# Patient Record
Sex: Female | Born: 1970 | Race: White | Hispanic: No | Marital: Single | Smoking: Never smoker
Health system: Southern US, Community
[De-identification: ages and names within clinical notes are randomized; demographics above are authoritative.]

---

## 2015-05-21 ENCOUNTER — Ambulatory Visit (HOSPITAL_BASED_OUTPATIENT_CLINIC_OR_DEPARTMENT_OTHER): Payer: Self-pay

## 2017-07-26 ENCOUNTER — Encounter (HOSPITAL_BASED_OUTPATIENT_CLINIC_OR_DEPARTMENT_OTHER): Payer: Self-pay | Admitting: *Deleted

## 2017-07-26 ENCOUNTER — Emergency Department (HOSPITAL_BASED_OUTPATIENT_CLINIC_OR_DEPARTMENT_OTHER)
Admission: EM | Admit: 2017-07-26 | Discharge: 2017-07-26 | Disposition: A | Discharge status: Home / Self Care | Payer: Self-pay | Attending: Emergency Medicine | Admitting: Emergency Medicine

## 2017-07-26 ENCOUNTER — Other Ambulatory Visit: Payer: Self-pay

## 2017-07-26 ENCOUNTER — Emergency Department (HOSPITAL_BASED_OUTPATIENT_CLINIC_OR_DEPARTMENT_OTHER): Payer: Self-pay

## 2017-07-26 DIAGNOSIS — M25561 Pain in right knee: Secondary | ICD-10-CM | POA: Insufficient documentation

## 2017-07-26 DIAGNOSIS — Z79899 Other long term (current) drug therapy: Secondary | ICD-10-CM | POA: Insufficient documentation

## 2017-07-26 MED ORDER — ACETAMINOPHEN 325 MG PO TABS
650.0000 mg | ORAL_TABLET | Freq: Once | ORAL | Status: AC
  Administered 2017-07-26: 650 mg via ORAL
  Filled 2017-07-26: qty 2

## 2017-07-26 NOTE — ED Provider Notes (Signed)
[facility] EMERGENCY DEPARTMENT Provider Note   CSN: [id number] Arrival date & time: 07/26/17  1321     History   Chief Complaint Chief Complaint  Patient presents with  . Knee Pain    HPI Laura Singh is a 46 y.o. female who presents with right knee pain that began this morning.  Patient does not recall any preceding trauma, injury, fall.  She does report that she recently started a new job that requires more standing.  Patient reports that she has not taking any medication for the pain.  Patient reports that pain is worsened with movement of the leg.  She states that she has been able to ambulate but it exacerbates her pain.  Patient denies any leg swelling, redness, fevers, numbness/weakness.  The history is provided by the patient.    History reviewed. No pertinent past medical history.  There are no active problems to display for this patient.   History reviewed. No pertinent surgical history.  OB History    No data available       Home Medications    Prior to Admission medications   Medication Sig Start Date End Date Taking? Authorizing Provider  GABAPENTIN PO Take by mouth.   Yes [provider]  QUEtiapine Fumarate (SEROQUEL PO) Take by mouth.   Yes [provider]    Family History No family history on file.  Social History Social History   Tobacco Use  . Smoking status: Never Smoker  . Smokeless tobacco: Never Used  Substance Use Topics  . Alcohol use: No    Frequency: Never  . Drug use: No     Allergies   Penicillins   Review of Systems Review of Systems  Musculoskeletal:       Right knee pain     Physical Exam Updated Vital Signs BP 125/80   Pulse 88   Temp 98.5 F (36.9 C) (Oral)   Resp 18   Ht 5' 7.5" (1.715 m)   Wt 108.9 kg (240 lb)   LMP 07/23/2017   SpO2 98%   BMI 37.03 kg/m   Physical Exam  Constitutional: She appears well-developed and well-nourished.  HENT:  Head: Normocephalic and  atraumatic.  Eyes: Conjunctivae and EOM are normal. Right eye exhibits no discharge. Left eye exhibits no discharge. No scleral icterus.  Cardiovascular:  Pulses:      Dorsalis pedis pulses are 2+ on the right side, and 2+ on the left side.  Pulmonary/Chest: Effort normal.  Musculoskeletal:  There is palpation to the anterior medial aspect of the right knee.  Very minimal overlying soft tissue swelling.  No warmth or erythema noted.  No deformity or crepitus noted.  No posterior knee tenderness.  No calf tenderness.  No edema of the lower extremity.  Negative anterior posterior drawer test of the right knee.  No instability noted on varus or valgus stress.  Neurological: She is alert.  Skin: Skin is warm and dry. Capillary refill takes less than 2 seconds.  Psychiatric: She has a normal mood and affect. Her speech is normal and behavior is normal.  Nursing note and vitals reviewed.    ED Treatments / Results  Labs (all labs ordered are listed, but only abnormal results are displayed) Labs Reviewed - No data to display  EKG  EKG Interpretation None       Radiology Dg Knee Complete 4 Views Right  Result Date: 07/26/2017 CLINICAL DATA:  Pain and swelling without injury, initial encounter EXAM:  RIGHT KNEE - COMPLETE 4+ VIEW COMPARISON:  None. FINDINGS: No evidence of fracture, dislocation, or joint effusion. No evidence of arthropathy or other focal bone abnormality. Soft tissues are unremarkable. IMPRESSION: No acute abnormality noted. Electronically Signed   By: Alcide CleverMark  Lukens M.D.   On: 07/26/2017 13:49    Procedures Procedures (including critical care time)  Medications Ordered in ED Medications  acetaminophen (TYLENOL) tablet 650 mg (not administered)     Initial Impression / Assessment and Plan / ED Course  I have reviewed the triage vital signs and the nursing notes.  Pertinent labs & imaging results that were available during my care of the patient were reviewed by me  and considered in my medical decision making (see chart for details).     46 year old female who presents for evaluation of right knee pain that began today.  Does not recall any preceding trauma, injury, fall.  She does state that she recently started a new job that requires more standing movement.  No redness, swelling, fevers.  No calf tenderness.  No recent travel. Patient is afebrile, non-toxic appearing, sitting comfortably on examination table. Vital signs reviewed and stable. Patient is neurovascularly intact.  Physical exam, patient has tenderness palpation of the medial aspect of the right knee.  There is some very mild soft tissue swelling but no overlying warmth, erythema noted.  No calf tenderness.  No swelling of the distal lower extremity.  Consider sprain versus strain versus arthritis versus muscular injury.  History/physical exam not concerning for bakers cyst, DVT or septic arthritis.  X-rays ordered at triage.  X-rays reviewed.  Negative for any acute fracture or dislocation.  No evidence of effusion.  Discussed results with patient.  We will plan to treat as a possible strain versus muscle skeletal injury.  Patient does have an orthopedic doctor.  Encouraged to follow-up with orthopedic for further evaluation.  Encourage conservative at home therapies. Patient had ample opportunity for questions and discussion. All patient's questions were answered with full understanding. Strict return precautions discussed. Patient expresses understanding and agreement to plan.    Final Clinical Impressions(s) / ED Diagnoses   Final diagnoses:  Acute pain of right knee    ED Discharge Orders    None       Rosana HoesLayden, Lindsey A, PA-C 07/26/17 2159    Lavera GuiseLiu, Dana Duo, MD 07/29/17 2204

## 2017-07-26 NOTE — ED Triage Notes (Signed)
Right knee pain. She woke with pain. No known injury.

## 2017-07-26 NOTE — Discharge Instructions (Signed)
You can take Tylenol or Ibuprofen as directed for pain. You can alternate Tylenol and Ibuprofen every 4 hours. If you take Tylenol at 1pm, then you can take Ibuprofen at 5pm. Then you can take Tylenol again at 9pm.   Follow the RICE (Rest, Ice, Compression, Elevation) protocol as directed.   Follow-up with your orthopedic doctor or the referred orthopedic doctor for further evaluation.  Return emerge pain, redness or swelling of the knee, numbness or weakness in the leg or any other worsening or concerning symptoms.

## 2017-07-26 NOTE — ED Notes (Signed)
Pt discharged to home NAD.  

## 2017-07-28 ENCOUNTER — Other Ambulatory Visit: Payer: Self-pay

## 2017-07-28 ENCOUNTER — Encounter (HOSPITAL_BASED_OUTPATIENT_CLINIC_OR_DEPARTMENT_OTHER): Payer: Self-pay

## 2017-07-28 ENCOUNTER — Emergency Department (HOSPITAL_BASED_OUTPATIENT_CLINIC_OR_DEPARTMENT_OTHER)
Admission: EM | Admit: 2017-07-28 | Discharge: 2017-07-28 | Disposition: A | Discharge status: Home / Self Care | Payer: Self-pay | Attending: Emergency Medicine | Admitting: Emergency Medicine

## 2017-07-28 DIAGNOSIS — M79604 Pain in right leg: Secondary | ICD-10-CM | POA: Insufficient documentation

## 2017-07-28 DIAGNOSIS — M25561 Pain in right knee: Secondary | ICD-10-CM

## 2017-07-28 DIAGNOSIS — Z79899 Other long term (current) drug therapy: Secondary | ICD-10-CM | POA: Insufficient documentation

## 2017-07-28 MED ORDER — DICLOFENAC SODIUM 1 % TD GEL: 2.0000 g | Freq: Four times a day (QID) | TRANSDERMAL | 0 refills | Status: AC

## 2017-07-28 NOTE — ED Provider Notes (Signed)
[facility] EMERGENCY DEPARTMENT Provider Note   CSN: [id number] Arrival date & time: 07/28/17  1151     History   Chief Complaint Chief Complaint  Patient presents with  . Knee Pain    HPI Laura Singh is a 46 y.o. female.  HPI   Patient is a 46 year old female with a history of low back pain, bipolar disorder, hyperlipidemia, presenting for pain in her right knee.  Patient reports that she woke up approximately 48 hours ago with acute pain in the right knee.  Patient had just started a new job with ambulation and kneeling on concrete prior to the onset of this pain.  No associated specific injury with this knee pain.  Knee has not been erythematous or warm.  Patient feels she notes that the knee was swollen.  No fever or chills.  Patient reports that she feels a shooting pain down her right leg on the anterior surface.  Patient reports she has had this pain before.  Patient presented 2 days ago for the symptoms.  Negative x-ray at that time.  No effusion noted on x-ray.  Patient discharged with naproxen and knee sleeve.  Subsequently, patient reports she is a difficulty performing her tasks at work due to this knee pain.  History reviewed. No pertinent past medical history.  There are no active problems to display for this patient.   History reviewed. No pertinent surgical history.  OB History    No data available       Home Medications    Prior to Admission medications   Medication Sig Start Date End Date Taking? Authorizing Provider  NAPROXEN PO Take by mouth.   Yes [provider]  diclofenac sodium (VOLTAREN) 1 % GEL Apply 2 g topically 4 (four) times daily for 7 days. 07/28/17 08/04/17  Aviva KluverMurray, Carrol Bondar B, PA-C  GABAPENTIN PO Take by mouth.    [provider]  QUEtiapine Fumarate (SEROQUEL PO) Take by mouth.    [provider]    Family History No family history on file.  Social History Social History   Tobacco Use  .  Smoking status: Never Smoker  . Smokeless tobacco: Never Used  Substance Use Topics  . Alcohol use: No    Frequency: Never  . Drug use: No     Allergies   Penicillins   Review of Systems Review of Systems  Constitutional: Negative for chills and fever.  Musculoskeletal: Positive for arthralgias and joint swelling.  Skin: Negative for color change.  Neurological: Negative for weakness and numbness.     Physical Exam Updated Vital Signs BP (!) 125/98 (BP Location: Left Arm)   Pulse 98   Temp 98.4 F (36.9 C) (Oral)   Resp 20   Ht 5\' 7"  (1.702 m)   Wt 116.6 kg (257 lb)   LMP 07/23/2017   SpO2 98%   BMI 40.25 kg/m   Physical Exam  Constitutional: She appears well-developed and well-nourished. No distress.  Sitting comfortably in bed.  HENT:  Head: Normocephalic and atraumatic.  Eyes: Conjunctivae are normal. Right eye exhibits no discharge. Left eye exhibits no discharge.  EOMs normal to gross examination.  Neck: Normal range of motion.  Cardiovascular: Normal rate and regular rhythm.  Intact, 2+ DP pulse of the right lower extremity.  Pulmonary/Chest:  Normal respiratory effort. Patient converses comfortably. No audible wheeze or stridor.  Abdominal: She exhibits no distension.  Musculoskeletal: Normal range of motion.  Right knee with tenderness to palpation of  lateral joint. Full ROM. No joint line tenderness.  There is a slight swelling of medial and lateral infrapatellar region. No abnormal alignment or patellar mobility. No bruising, erythema or warmth overlaying the joint. No varus/valgus laxity. Negative drawer's, Lachman's and McMurray's.  No crepitus.  2+ DP pulses bilaterally. All compartments are soft. Sensation intact distal to injury. Antalgic gait.  Neurological: She is alert.  Cranial nerves intact to gross observation. Patient moves extremities without difficulty.  Skin: Skin is warm and dry. She is not diaphoretic.  Psychiatric: She has a normal  mood and affect. Her behavior is normal. Judgment and thought content normal.  Nursing note and vitals reviewed.    ED Treatments / Results  Labs (all labs ordered are listed, but only abnormal results are displayed) Labs Reviewed - No data to display  EKG  EKG Interpretation None       Radiology Dg Knee Complete 4 Views Right  Result Date: 07/26/2017 CLINICAL DATA:  Pain and swelling without injury, initial encounter EXAM: RIGHT KNEE - COMPLETE 4+ VIEW COMPARISON:  None. FINDINGS: No evidence of fracture, dislocation, or joint effusion. No evidence of arthropathy or other focal bone abnormality. Soft tissues are unremarkable. IMPRESSION: No acute abnormality noted. Electronically Signed   By: Alcide CleverMark  Lukens M.D.   On: 07/26/2017 13:49    Procedures Procedures (including critical care time)  Medications Ordered in ED Medications - No data to display   Initial Impression / Assessment and Plan / ED Course  I have reviewed the triage vital signs and the nursing notes.  Pertinent labs & imaging results that were available during my care of the patient were reviewed by me and considered in my medical decision making (see chart for details).     Final Clinical Impressions(s) / ED Diagnoses   Final diagnoses:  Acute pain of right knee   Patient is nontoxic-appearing, afebrile, and in no acute distress.  X-rays performed 2 days ago reviewed, and reveal no bony abnormality or obvious effusion on x-ray.  Clinical presentation does not warrant repeating xray today.  Patient's knee examination is not concerning for ligamentous injury.  The anterior knee pain and leg pain is not consistent with DVT.  Intact distal pulses. Also doubt ruptured Baker's cyst.  The knee is not erythematous and patient exhibits full range of motion, therefore doubt septic arthritis.  There is a small possible effusion over the anterior surface of the knee that may represent bursitis versus degenerative meniscus  with associated effusion versus a small hemarthrosis.  This does not warrant arthrocentesis.  This was discussed with Dr. Loren Raceravid Yelverton.  I discussed these pathologies with the patient, and encouraged her to follow-up on Monday with the orthopedist.  Return precautions given for any fever or chills with symptoms, or erythematous, immobile knee.  Diclofenac gel added to patient's current regimen and encouraged rest, compression, ice, and elevation.  Patient is in understanding and agrees with the plan of care.  ED Discharge Orders        Ordered    diclofenac sodium (VOLTAREN) 1 % GEL  4 times daily     07/28/17 1318       Delia ChimesMurray, Ezmeralda Stefanick B, PA-C 07/28/17 1805    Loren RacerYelverton, David, MD 08/03/17 1505

## 2017-07-28 NOTE — Discharge Instructions (Signed)
It was my pleasure taking care of you today!   Please see the information and instructions below regarding your visit.  Your diagnoses today include:  1. Acute pain of right knee     Tests performed today include: See side panel of your discharge paperwork for testing performed today. Vital signs are listed at the bottom of these instructions.   Medications prescribed:    Take any prescribed medications only as prescribed, and any over the counter medications only as directed on the packaging.  Diclofenac gel as needed for pain. Use crutches as needed for comfort. Ice and elevate knee throughout the day.  Home care instructions:  Please follow any educational materials contained in this packet.   Follow-up instructions: Please follow-up with your primary care provider in as needed for further evaluation of your symptoms if they are not completely improved.   Call the orthopedist listed today or tomorrow to schedule follow up appointment for recheck of ongoing knee pain in one to two weeks. That appointment can be canceled with a 24-48 hour notice if complete resolution of pain.   Follow up with the orthopedist listed if symptoms do not improve in one week.   Return instructions:  Please return to the Emergency Department if you experience worsening symptoms.  Please return for any increasing swelling, increasing pain, loss of color to the lower leg, or increasing redness. Please return if you have any other emergent concerns.  Additional Information:  Your vital signs today were: BP (!) 125/98 (BP Location: Left Arm)    Pulse 98    Temp 98.4 F (36.9 C) (Oral)    Resp 20    Ht 5\' 7"  (1.702 m)    Wt 116.6 kg (257 lb)    LMP 07/23/2017    SpO2 98%    BMI 40.25 kg/m  If your blood pressure (BP) was elevated on multiple readings during this visit above 130 for the top number or above 80 for the bottom number, please have this repeated by your primary care provider within one  month. --------------

## 2017-07-28 NOTE — ED Triage Notes (Signed)
Pt c/o right knee pain x 3 days-seen here yesterday for same-[facility]NAD-limping gait

## 2017-07-28 NOTE — ED Notes (Signed)
Pt reports that she was unable to return to work today. Sts that she was put out of work until today.

## 2018-03-12 IMAGING — CR DG KNEE COMPLETE 4+V*R*
5 series · 5 of 5 positions shown · non-contrast
Comparison: None.

CLINICAL DATA: Pain and swelling without injury, initial encounter

EXAM:
RIGHT KNEE - COMPLETE 4+ VIEW

[t knee ap right (1 of 2)]
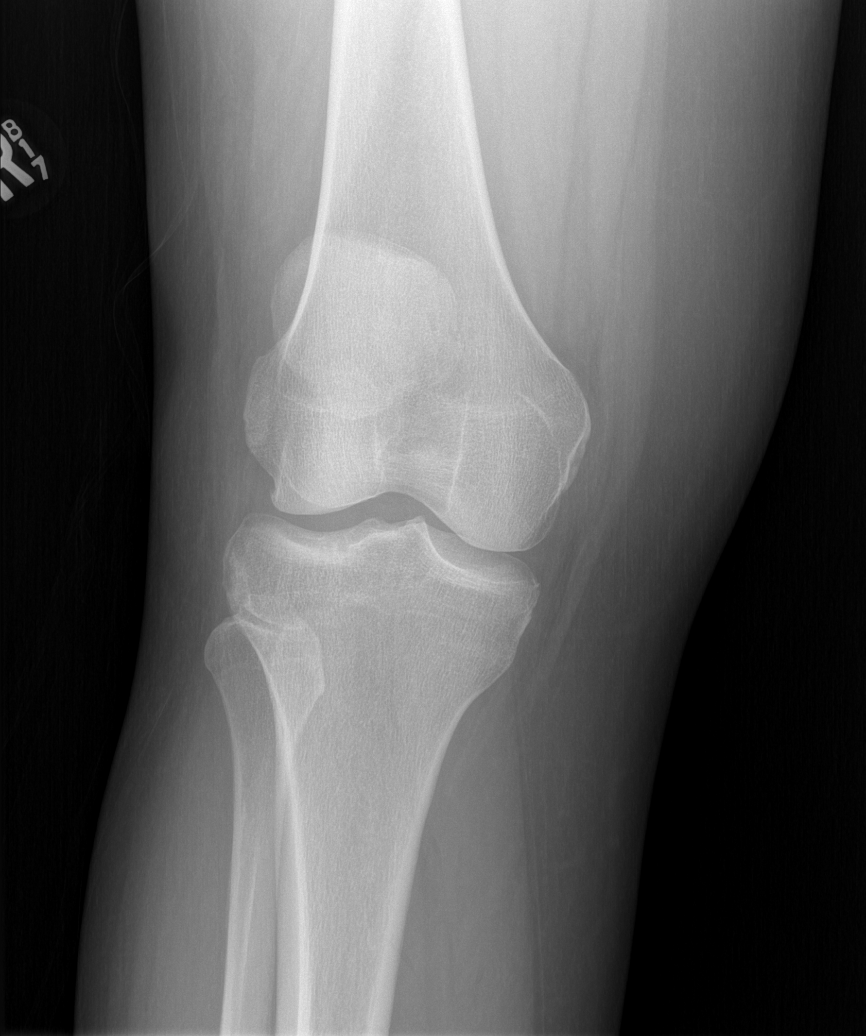

[t knee ap right (2 of 2)]
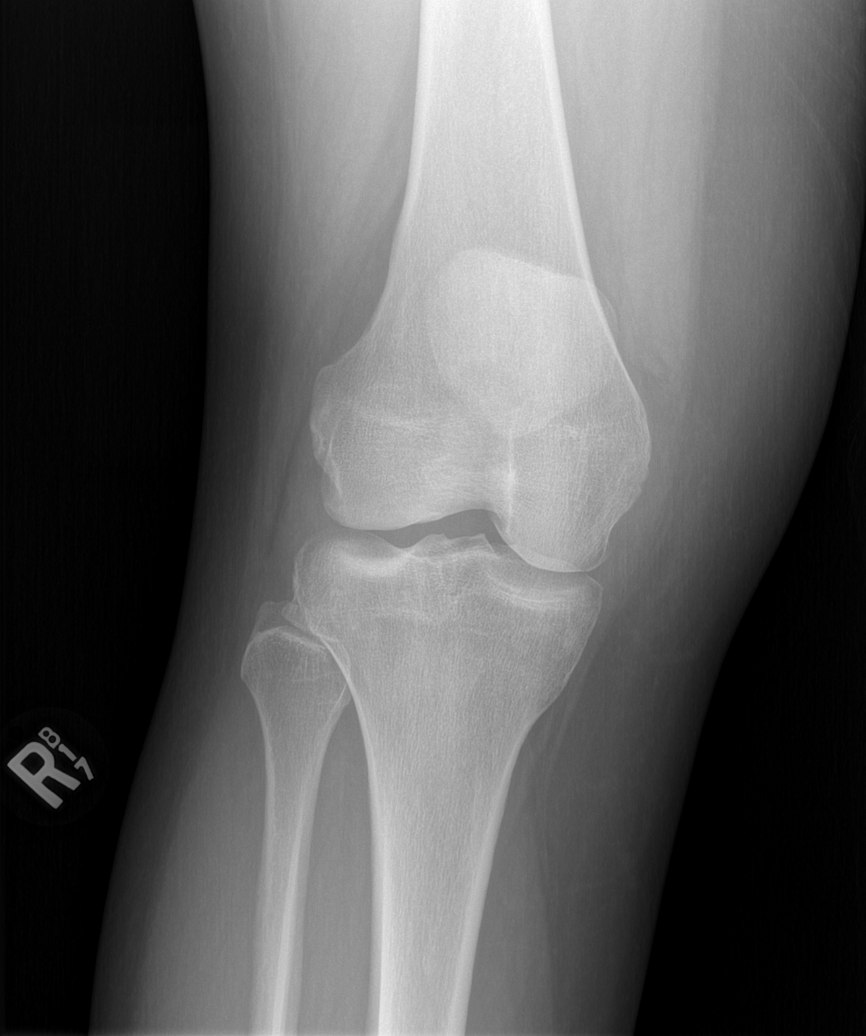

[t knee oblique right (1 of 2)]
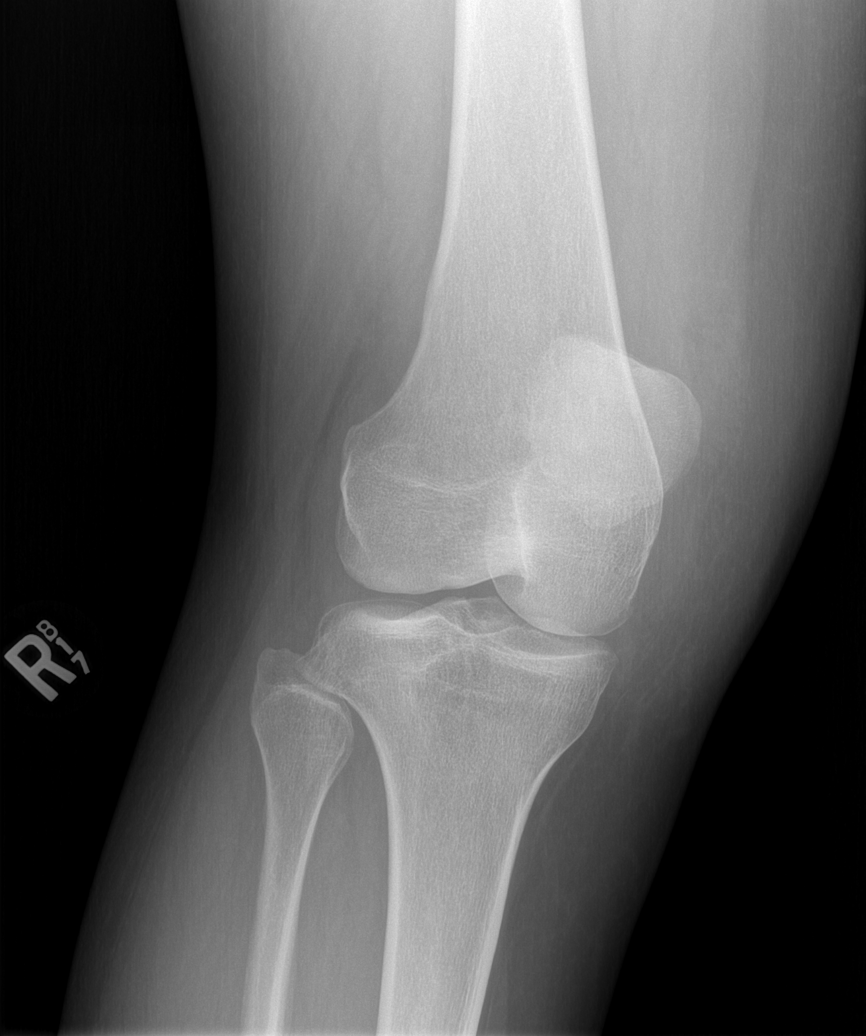

[t knee oblique right (2 of 2)]
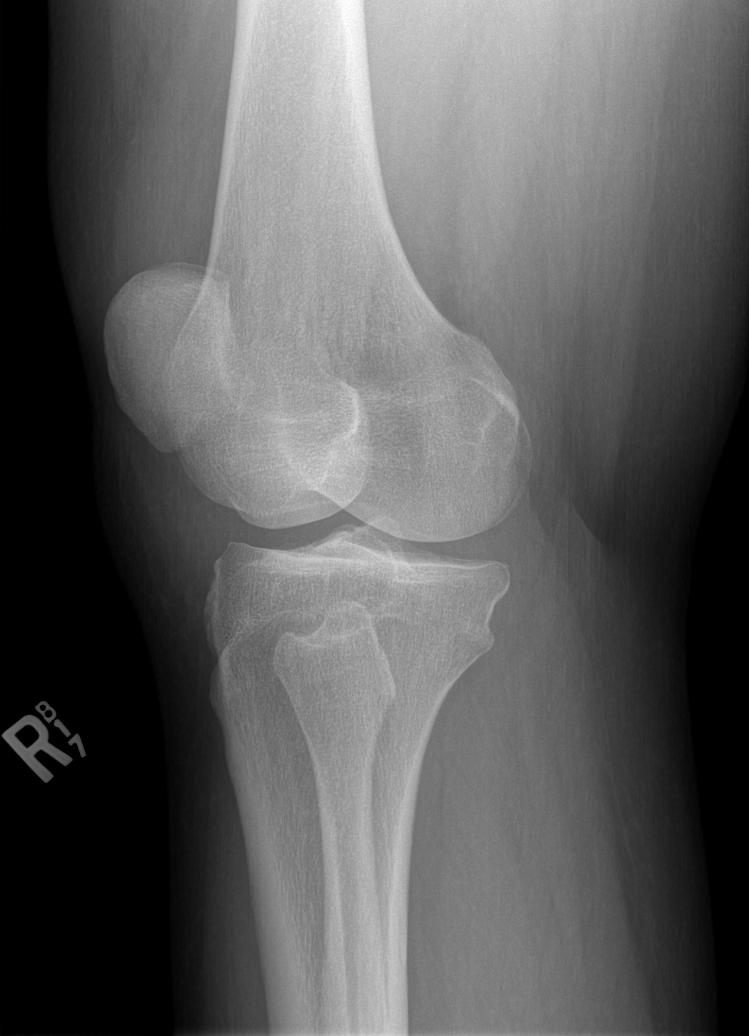

[t knee lat right]
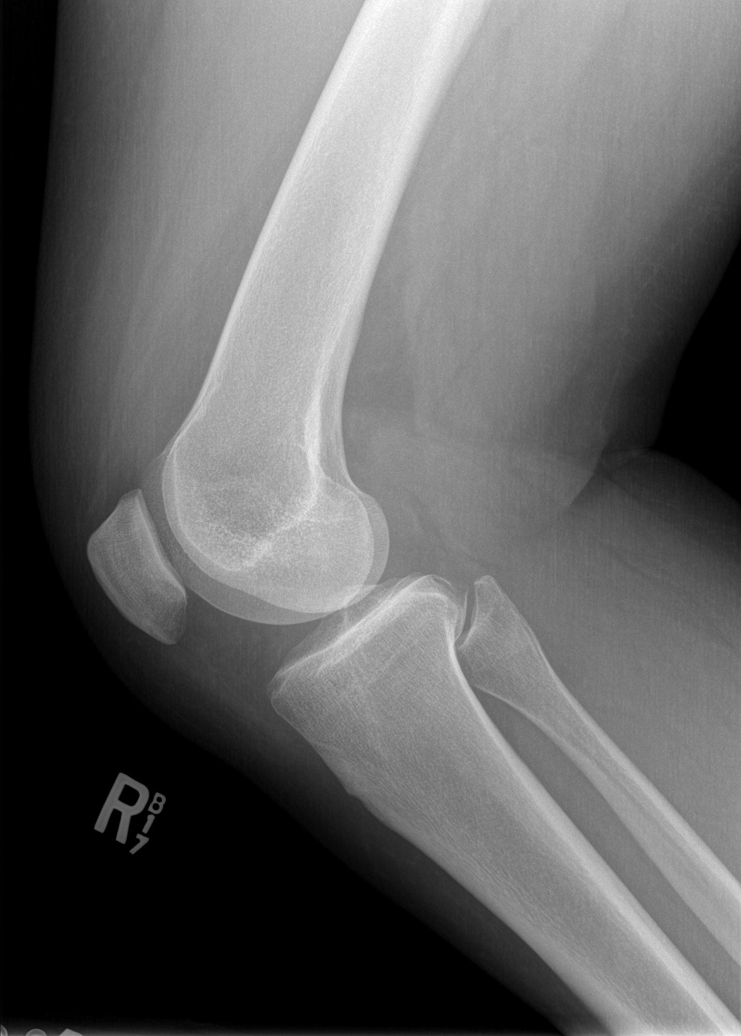

[5 of 5 positions shown; findings below may reference images not displayed]

FINDINGS: No evidence of fracture, dislocation, or joint effusion. No evidence
of arthropathy or other focal bone abnormality. Soft tissues are
unremarkable.
IMPRESSION: No acute abnormality noted.

## 2021-04-20 ENCOUNTER — Emergency Department (HOSPITAL_BASED_OUTPATIENT_CLINIC_OR_DEPARTMENT_OTHER)
Admission: EM | Admit: 2021-04-20 | Discharge: 2021-04-20 | Disposition: A | Discharge status: Home / Self Care | Payer: Self-pay | Attending: Emergency Medicine | Admitting: Emergency Medicine

## 2021-04-20 ENCOUNTER — Other Ambulatory Visit: Payer: Self-pay

## 2021-04-20 ENCOUNTER — Encounter (HOSPITAL_BASED_OUTPATIENT_CLINIC_OR_DEPARTMENT_OTHER): Payer: Self-pay | Admitting: *Deleted

## 2021-04-20 ENCOUNTER — Emergency Department (HOSPITAL_BASED_OUTPATIENT_CLINIC_OR_DEPARTMENT_OTHER): Payer: Self-pay

## 2021-04-20 DIAGNOSIS — R0981 Nasal congestion: Secondary | ICD-10-CM | POA: Insufficient documentation

## 2021-04-20 DIAGNOSIS — U071 COVID-19: Secondary | ICD-10-CM | POA: Insufficient documentation

## 2021-04-20 DIAGNOSIS — R000 Tachycardia, unspecified: Secondary | ICD-10-CM | POA: Insufficient documentation

## 2021-04-20 LAB — RESP PANEL BY RT-PCR (FLU A&B, COVID) ARPGX2
Influenza A by PCR: NEGATIVE
Influenza B by PCR: NEGATIVE
SARS Coronavirus 2 by RT PCR: POSITIVE — AB

## 2021-04-20 MED ORDER — NIRMATRELVIR/RITONAVIR (PAXLOVID)TABLET: 3.0000 | ORAL_TABLET | Freq: Two times a day (BID) | ORAL | 0 refills | Status: AC

## 2021-04-20 MED ORDER — ACETAMINOPHEN 500 MG PO TABS
1000.0000 mg | ORAL_TABLET | Freq: Once | ORAL | Status: AC
  Administered 2021-04-20: 1000 mg via ORAL
  Filled 2021-04-20: qty 2

## 2021-04-20 NOTE — ED Triage Notes (Signed)
Cough, subjective fever, loss of taste since thursday

## 2021-04-20 NOTE — Discharge Instructions (Addendum)
You were seen in the ER today for your loss of taste and congestion.  You tested positive COVID-19.  You have been prescribed combination of antiviral medications called Paxlovid, to treat your COVID-19 infection.  Please take this as prescribed for the next 5 days.  You may pick this up at the pharmacy listed below, free of charge.  Please do not take the Seroquel while you are on this medication.  You may use over-the-counter medications as needed for your symptoms.  You should not be in work for the next 7 days.  You may return to work on Thursday, 04/24/2021.  Return to the ER with any new difficulty breathing, nausea, does not stop, or any additional symptoms.

## 2021-04-20 NOTE — ED Provider Notes (Signed)
[facility] EMERGENCY DEPARTMENT Provider Note   CSN: [id number] Arrival date & time: 04/20/21  1334     History Chief Complaint  Patient presents with   Cough    Laura Singh is a 50 y.o. female who presents with concern Nonproductive cough, nasal congestion, runny nose, and headache since Thursday, now 24 hours complete loss of taste.    Patient tearful during my exam, patient is fearful that she may have given her illness to her mother.  Endorses some chest soreness with coughing no palpitations, shortness of breath, syncope, chest pain at rest.  Does have nausea without vomiting; diarrhea, without melena or hematochezia.  I personally reviewed patient's medical records.  She does not carry medical diagnoses and is on Seroquel daily for sleep aid, per patient.   HPI     History reviewed. No pertinent past medical history.  There are no problems to display for this patient.   History reviewed. No pertinent surgical history.   OB History   No obstetric history on file.     No family history on file.  Social History   Tobacco Use   Smoking status: Never   Smokeless tobacco: Never  Vaping Use   Vaping Use: Never used  Substance Use Topics   Alcohol use: No   Drug use: No    Home Medications Prior to Admission medications   Medication Sig Start Date End Date Taking? Authorizing Provider  nirmatrelvir/ritonavir EUA (PAXLOVID) 20 x 150 MG & 10 x 100MG  TABS Take 3 tablets by mouth 2 (two) times daily for 5 days. Patient GFR is >60. Take nirmatrelvir (150 mg) two tablets twice daily for 5 days and ritonavir (100 mg) one tablet twice daily for 5 days. 04/20/21 04/25/21 Yes Euleta Belson R, PA-C  GABAPENTIN PO Take by mouth.    [provider]  NAPROXEN PO Take by mouth.    [provider]  QUEtiapine Fumarate (SEROQUEL PO) Take by mouth.    [provider]    Allergies    Penicillins  Review of Systems   Review of Systems   Constitutional:  Positive for activity change, appetite change, chills, fatigue and fever.  HENT:  Positive for congestion, rhinorrhea and sore throat. Negative for nosebleeds, sinus pain, trouble swallowing and voice change.        LOSS of taste  Eyes: Negative.   Respiratory:  Positive for cough. Negative for chest tightness and shortness of breath.   Cardiovascular: Negative.   Gastrointestinal:  Positive for diarrhea and nausea. Negative for abdominal distention, abdominal pain, anal bleeding, blood in stool, constipation and vomiting.  Genitourinary: Negative.   Skin: Negative.   Neurological:  Positive for headaches. Negative for dizziness, weakness and light-headedness.   Physical Exam Updated Vital Signs BP 138/84 (BP Location: Left Arm)   Pulse 99   Temp 98.7 F (37.1 C) (Oral)   Resp 16   Ht 5' 7.5" (1.715 m)   Wt 117.9 kg   SpO2 97%   BMI 40.12 kg/m   Physical Exam Vitals and nursing note reviewed.  Constitutional:      Appearance: She is obese. She is not ill-appearing or toxic-appearing.  HENT:     Head: Normocephalic and atraumatic.     Nose: Congestion and rhinorrhea present.     Mouth/Throat:     Mouth: Mucous membranes are moist.     Pharynx: Oropharynx is clear. Uvula midline. No oropharyngeal exudate or posterior oropharyngeal erythema.  Tonsils: No tonsillar exudate.     Comments: No sign of Oropharyngeal abscess, no sublingual or submental TTP.  Eyes:     General: Lids are normal. Vision grossly intact.        Right eye: No discharge.        Left eye: No discharge.     Extraocular Movements: Extraocular movements intact.     Conjunctiva/sclera: Conjunctivae normal.     Pupils: Pupils are equal, round, and reactive to light.  Neck:     Trachea: Trachea and phonation normal.  Cardiovascular:     Rate and Rhythm: Normal rate and regular rhythm.     Pulses: Normal pulses.     Heart sounds: Normal heart sounds. No murmur heard. Pulmonary:      Effort: Pulmonary effort is normal. No tachypnea, bradypnea, accessory muscle usage, prolonged expiration or respiratory distress.     Breath sounds: Normal breath sounds. No wheezing or rales.  Chest:     Chest wall: No mass, lacerations, deformity, swelling, tenderness, crepitus or edema.  Abdominal:     General: Bowel sounds are normal. There is no distension.     Palpations: Abdomen is soft.     Tenderness: There is no abdominal tenderness. There is no right CVA tenderness, left CVA tenderness, guarding or rebound.  Musculoskeletal:        General: No deformity.     Cervical back: Normal range of motion and neck supple. No edema, rigidity or crepitus. No pain with movement, spinous process tenderness or muscular tenderness.     Right lower leg: No edema.     Left lower leg: No edema.  Lymphadenopathy:     Cervical: No cervical adenopathy.  Skin:    General: Skin is warm and dry.     Capillary Refill: Capillary refill takes less than 2 seconds.  Neurological:     General: No focal deficit present.     Mental Status: She is alert and oriented to person, place, and time. Mental status is at baseline.  Psychiatric:        Mood and Affect: Mood normal.    ED Results / Procedures / Treatments   Labs (all labs ordered are listed, but only abnormal results are displayed) Labs Reviewed  RESP PANEL BY RT-PCR (FLU A&B, COVID) ARPGX2 - Abnormal; Notable for the following components:      Result Value   SARS Coronavirus 2 by RT PCR POSITIVE (*)    All other components within normal limits    EKG None  Radiology DG Chest Portable 1 View  Result Date: 04/20/2021 CLINICAL DATA:  Cough.  Subjective fever. EXAM: PORTABLE CHEST 1 VIEW.  Patient rotation and leaning back. COMPARISON:  Chest x-ray 09/17/2014. FINDINGS: Slightly more prominent cardiomediastinal silhouette likely due to AP portable technique. Slightly more prominent aortic arch silhouette likely due to AP portable technique and  slight patient rotation. Otherwise the heart and mediastinal contours are unchanged. No focal consolidation. No pulmonary edema. No pleural effusion. No pneumothorax. No acute osseous abnormality. IMPRESSION: No active disease. Electronically Signed   By: Tish Frederickson M.D.   On: 04/20/2021 15:16    Procedures Procedures   Medications Ordered in ED Medications  acetaminophen (TYLENOL) tablet 1,000 mg (1,000 mg Oral Given 04/20/21 1439)    ED Course  I have reviewed the triage vital signs and the nursing notes.  Pertinent labs & imaging results that were available during my care of the patient were reviewed by me and considered  in my medical decision making (see chart for details).    MDM Rules/Calculators/A&P                         50 year old female presents with concern for loss of taste, congestion, headache, nausea, diarrhea x4 days.  Vaccinated against COVID-19.  Differential diagnosis includes secondary to acute viral infection such as COVID-19 or influenza, other viral URI, pneumonia, pleural effusion, PE.  Mildly tachycardic on intake, however at the time my exam heart rate is 94.  Patient is PERC negative.  Cardiopulmonary exam is normal, abdominal exam is benign.  HEENT exam with nasal congestion and rhinorrhea, clear.  No posterior pharyngeal erythema or exudate, no oropharyngeal calculus.  No cervical lymphadenopathy.  No rashes.  Patient neurovascularly intact in all 4 extremities. Crying, anxious.   Chest x-ray negative for acute cardiopulmonary disease.  Patient is COVID-19 positive.  She is eligible for Paxlovid with cessation of Seroquel for the next 5 days.  Patient voiced understanding of her treatment options and expressed wishes to proceed with antiviral medication at this time.  No further warranted in the ED at this time.  Isolation precautions given.  Laura Singh voiced understanding of her medical evaluation and treatment plan.  Each of her questions answered to express  as fashion.  Return precautions given.  Patient is well-appearing, stable, and appropriate for discharge.  This chart was dictated using voice recognition software, Dragon. Despite the best efforts of this provider to proofread and correct errors, errors may still occur which can change documentation meaning.  Laura Singh was evaluated in Emergency Department on 04/20/2021 for the symptoms described in the history of present illness. She was evaluated in the context of the global COVID-19 pandemic, which necessitated consideration that the patient might be at risk for infection with the SARS-CoV-2 virus that causes COVID-19. Institutional protocols and algorithms that pertain to the evaluation of patients at risk for COVID-19 are in a state of rapid change based on information released by regulatory bodies including the CDC and federal and state organizations. These policies and algorithms were followed during the patient's care in the ED.  Final Clinical Impression(s) / ED Diagnoses Final diagnoses:  COVID-19    Rx / DC Orders ED Discharge Orders          Ordered    nirmatrelvir/ritonavir EUA (PAXLOVID) 20 x 150 MG & 10 x 100MG  TABS  2 times daily        04/20/21 [address], PA-C 04/20/21 1614    04/22/21, MD 04/24/21 1517

## 2021-12-05 IMAGING — DX DG CHEST 1V PORT
1 series · 1 of 1 positions shown · non-contrast
Comparison: Chest x-ray 09/17/2014.

CLINICAL DATA: Cough.  Subjective fever.

EXAM:
PORTABLE CHEST 1 VIEW.  Patient rotation and leaning back.

[chest ap]
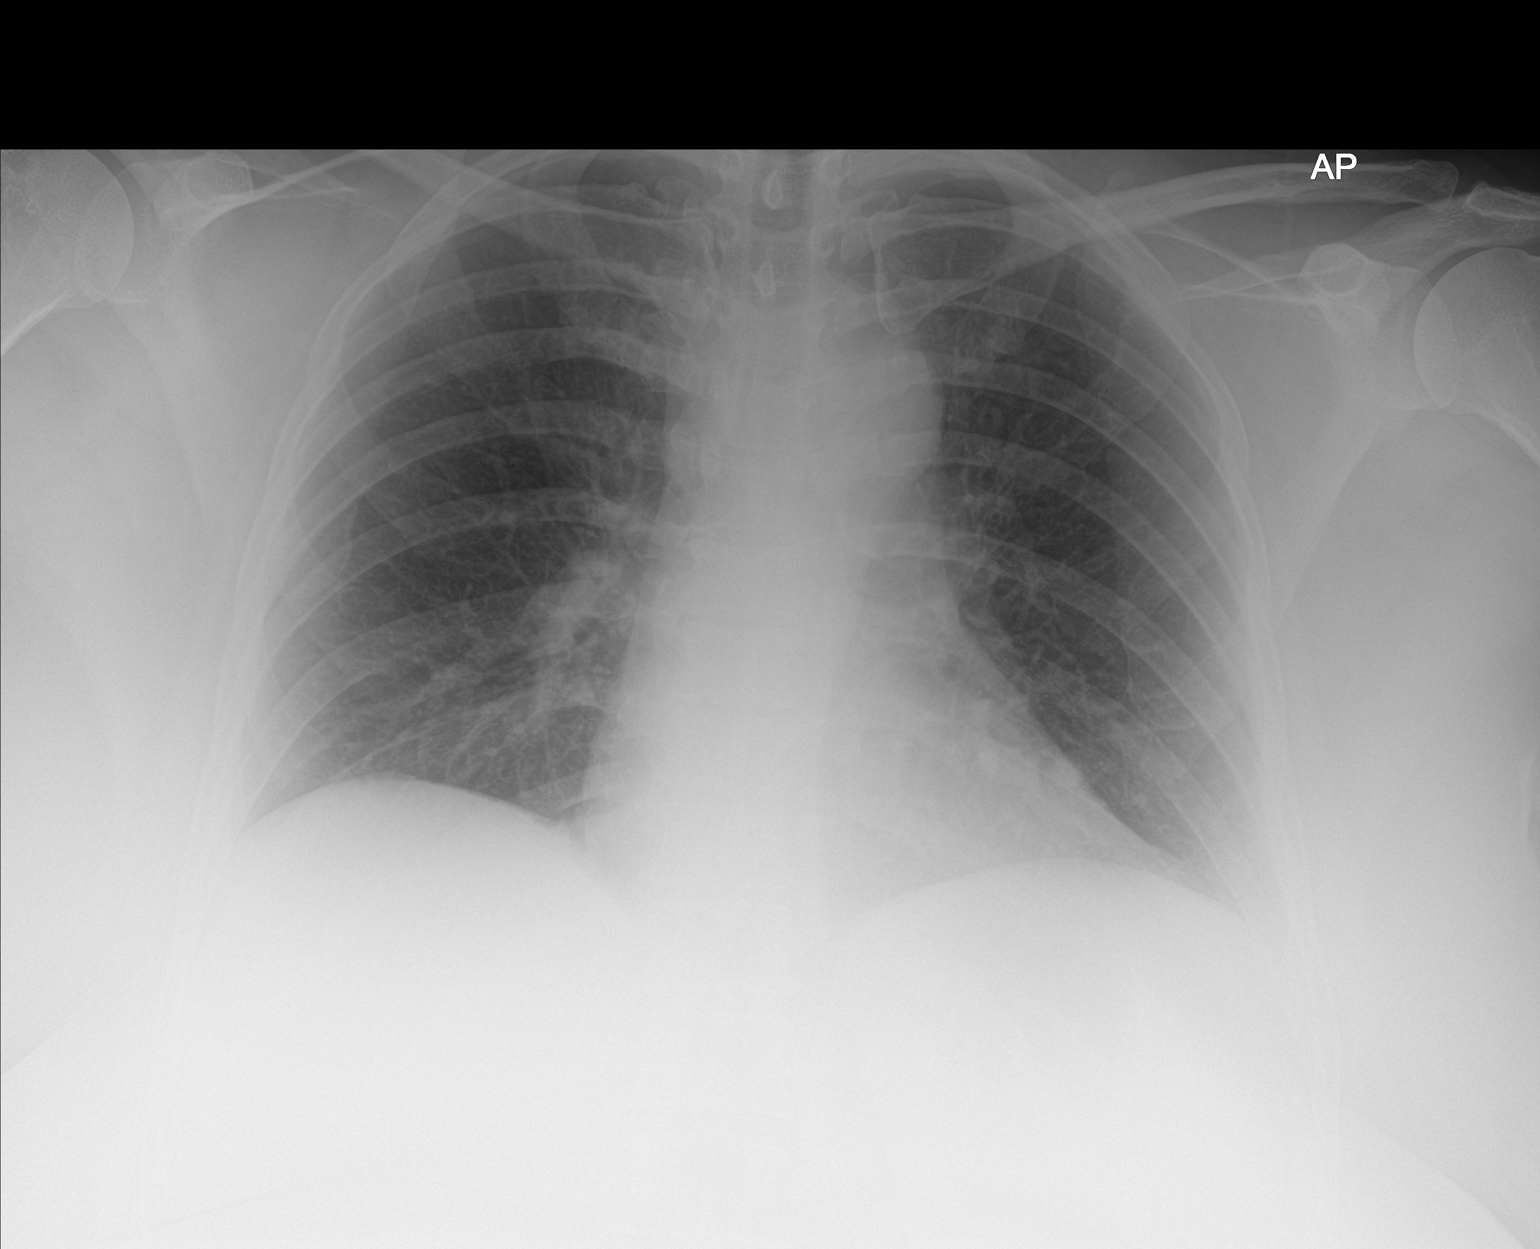

[1 of 1 positions shown; findings below may reference images not displayed]

FINDINGS: Slightly more prominent cardiomediastinal silhouette likely due to
AP portable technique. Slightly more prominent aortic arch
silhouette likely due to AP portable technique and slight patient
rotation. Otherwise the heart and mediastinal contours are
unchanged.

No focal consolidation. No pulmonary edema. No pleural effusion. No
pneumothorax.

No acute osseous abnormality.
IMPRESSION: No active disease.
